# Patient Record
Sex: Female | Born: 1990 | Race: Black or African American | Hispanic: No | Marital: Single | Smoking: Never smoker
Health system: Southern US, Community
[De-identification: ages and names within clinical notes are randomized; demographics above are authoritative.]

---

## 2012-10-03 ENCOUNTER — Emergency Department (HOSPITAL_COMMUNITY)
Admission: EM | Admit: 2012-10-03 | Discharge: 2012-10-03 | Disposition: A | Discharge status: Home / Self Care | Payer: Self-pay | Attending: Emergency Medicine | Admitting: Emergency Medicine

## 2012-10-03 ENCOUNTER — Encounter (HOSPITAL_COMMUNITY): Payer: Self-pay | Admitting: Emergency Medicine

## 2012-10-03 DIAGNOSIS — H01009 Unspecified blepharitis unspecified eye, unspecified eyelid: Secondary | ICD-10-CM | POA: Insufficient documentation

## 2012-10-03 DIAGNOSIS — H5789 Other specified disorders of eye and adnexa: Secondary | ICD-10-CM | POA: Insufficient documentation

## 2012-10-03 DIAGNOSIS — H01006 Unspecified blepharitis left eye, unspecified eyelid: Secondary | ICD-10-CM

## 2012-10-03 MED ORDER — ERYTHROMYCIN 5 MG/GM OP OINT
TOPICAL_OINTMENT | Freq: Once | OPHTHALMIC | Status: AC
  Administered 2012-10-03: 20:00:00 via OPHTHALMIC
  Filled 2012-10-03: qty 3.5

## 2012-10-03 NOTE — ED Notes (Signed)
Pt c/o L eye swelling, drainage, burning, and itching x3 days.  Pt also reports a bump is present.  Stated that she wear contact lenses, but took them out Friday.  Denies injury.

## 2012-10-03 NOTE — ED Provider Notes (Signed)
History   This chart was scribed for non-physician practitioner working with Isabel Briggs. Isabel Payor, MD by Isabel Briggs, ED Scribe. This patient was seen in room WTR5/WTR5 and the patient's care was started at 1922.   CSN: [id number]  Arrival date & time 10/03/12  Isabel Briggs   First MD Initiated Contact with Patient 10/03/12 1925      Chief Complaint  Patient presents with  . Eye Pain     The history is provided by the patient. No language interpreter was used.   Isabel Briggs is a 22 y.o. female who presents to the Emergency Department complaining of new, constant, gradually worsening left eye pain with associated swelling, burning, and itching starting 3 days ago. Pt states that today she noticed discharge from the same eye. Pt states she removed her contact lenses 1 day ago. She has not tried any alleviating factors. Pt denies fever, chills, HA, visual disturbances.   No past medical history on file.  No past surgical history on file.  No family history on file.  History  Substance Use Topics  . Smoking status: Not on file  . Smokeless tobacco: Not on file  . Alcohol Use: Not on file    OB History    No data available      Review of Systems  A complete 10 system review of systems was obtained and all systems are negative except as noted in the HPI and PMH.   Allergies  Review of patient's allergies indicates not on file.  Home Medications  No current outpatient prescriptions on file.  BP 144/93  Pulse 114  Temp 98.8 F (37.1 C) (Oral)  Resp 18  SpO2 100%  Physical Exam  Nursing note and vitals reviewed. Constitutional: She is oriented to person, place, and time. She appears well-developed and well-nourished. No distress.  HENT:  Head: Normocephalic and atraumatic.  Eyes: Conjunctivae normal and EOM are normal. Pupils are equal, round, and reactive to light. Right eye exhibits no discharge. Left eye exhibits discharge. Left eye exhibits no hordeolum. No foreign  body present in the left eye. Left conjunctiva is not injected.       Green and white discharge from lower left eyelid. No erythema, edema, tenderness, visual disturbances.  Neck: Neck supple. No tracheal deviation present.  Cardiovascular: Normal rate, regular rhythm and normal heart sounds.   Pulmonary/Chest: Effort normal and breath sounds normal. No respiratory distress. She has no wheezes. She has no rales.  Musculoskeletal: Normal range of motion.  Lymphadenopathy:    She has no cervical adenopathy.  Neurological: She is alert and oriented to person, place, and time.  Skin: Skin is warm and dry.  Psychiatric: She has a normal mood and affect. Her behavior is normal.    ED Course  Procedures (including critical care time)  DIAGNOSTIC STUDIES: Oxygen Saturation is 100% on room air, normal by my interpretation.    COORDINATION OF CARE:  7:39 PM Discussed treatment plan which includes eye drops with pt at bedside and pt agreed to plan.      Labs Reviewed - No data to display No results found.   1. Blepharitis of left eye       MDM  22 y/o female with anterior blepharitis. Rx azithromycin ointment. Advised to discard her contact lenses and begin a new pair in a week. Advised warm compresses. Return precautions discussed. Patient states understanding of plan and is agreeable.    I personally performed the services described in this documentation,  which was scribed in my presence. The recorded information has been reviewed and is accurate.    Isabel Mace, PA-C 10/03/12 1952

## 2012-10-03 NOTE — ED Provider Notes (Signed)
Medical screening examination/treatment/procedure(s) were performed by non-physician practitioner and as supervising physician I was immediately available for consultation/collaboration.  Terril Chestnut R. Shemiah Rosch, MD 10/03/12 2347 

## 2012-12-28 ENCOUNTER — Encounter (HOSPITAL_COMMUNITY): Payer: Self-pay | Admitting: Emergency Medicine

## 2012-12-28 ENCOUNTER — Emergency Department (HOSPITAL_COMMUNITY)
Admission: EM | Admit: 2012-12-28 | Discharge: 2012-12-28 | Disposition: A | Discharge status: Home / Self Care | Payer: Self-pay | Attending: Emergency Medicine | Admitting: Emergency Medicine

## 2012-12-28 DIAGNOSIS — N9489 Other specified conditions associated with female genital organs and menstrual cycle: Secondary | ICD-10-CM | POA: Insufficient documentation

## 2012-12-28 DIAGNOSIS — N760 Acute vaginitis: Secondary | ICD-10-CM | POA: Insufficient documentation

## 2012-12-28 DIAGNOSIS — R300 Dysuria: Secondary | ICD-10-CM | POA: Insufficient documentation

## 2012-12-28 DIAGNOSIS — N898 Other specified noninflammatory disorders of vagina: Secondary | ICD-10-CM | POA: Insufficient documentation

## 2012-12-28 DIAGNOSIS — Z8619 Personal history of other infectious and parasitic diseases: Secondary | ICD-10-CM | POA: Insufficient documentation

## 2012-12-28 DIAGNOSIS — Z3202 Encounter for pregnancy test, result negative: Secondary | ICD-10-CM | POA: Insufficient documentation

## 2012-12-28 LAB — URINALYSIS, ROUTINE W REFLEX MICROSCOPIC
Ketones, ur: NEGATIVE mg/dL
Leukocytes, UA: NEGATIVE
Nitrite: NEGATIVE
Protein, ur: 30 mg/dL — AB
pH: 6 (ref 5.0–8.0)

## 2012-12-28 LAB — WET PREP, GENITAL: Yeast Wet Prep HPF POC: NONE SEEN

## 2012-12-28 LAB — URINE MICROSCOPIC-ADD ON

## 2012-12-28 LAB — POCT PREGNANCY, URINE: Preg Test, Ur: NEGATIVE

## 2012-12-28 MED ORDER — FLUCONAZOLE 150 MG PO TABS: 150.0000 mg | ORAL_TABLET | Freq: Once | ORAL | Status: DC

## 2012-12-28 MED ORDER — METRONIDAZOLE 500 MG PO TABS: 500.0000 mg | ORAL_TABLET | Freq: Two times a day (BID) | ORAL | Status: DC

## 2012-12-28 NOTE — ED Notes (Signed)
Pt states that she has had internal vaginal itching and some swelling to external labia area since Sunday. Denies any open lesions. Denies any frequency.

## 2012-12-28 NOTE — ED Provider Notes (Signed)
History    This chart was scribed for non-physician practitioner working with Isabel Briggs. [name], MD by Sofie Rower, ED Scribe. This patient was seen in room WTR9/WTR9 and the patient's care was started at 6:06PM.   CSN: [id number]  Arrival date & time 12/28/12  1713   First MD Initiated Contact with Patient 12/28/12 1806      Chief Complaint  Patient presents with  . Vaginal Itching    (Consider location/radiation/quality/duration/timing/severity/associated sxs/prior treatment) The history is provided by the patient. No language interpreter was used.    Isabel Briggs is a 22 y.o. female , with a hx of yeast infection (diagnosed 1 year ago), who presents to the Emergency Department complaining of gradual, progressively worsening, vaginal itching, onset thee days ago (12/25/12).  Associated symptoms include dysuria, white, thick, curd like, vaginal discharge and vaginal swelling. The pt reports she has been experiencing vaginal itching and swelling since Saturday afternoon (12/25/12), which has prompted her concern and desire to seek medical evaluation this evening (12/28/12). Additionally, the pt informs she has abstained from any sexual activity within the past year.  The pt denies abdominal pain, vomiting, fever, and chills.   The pt does not smoke or drink alcohol.      History reviewed. No pertinent past medical history.  History reviewed. No pertinent past surgical history.  No family history on file.  History  Substance Use Topics  . Smoking status: Never Smoker   . Smokeless tobacco: Not on file  . Alcohol Use: No    OB History   Grav Para Term Preterm Abortions TAB SAB Ect Mult Living                  Review of Systems  Constitutional: Negative for fever and chills.  Gastrointestinal: Negative for vomiting and abdominal pain.  Genitourinary: Positive for dysuria, vaginal discharge and vaginal pain.  All other systems reviewed and are  negative.    Allergies  Review of patient's allergies indicates no known allergies.  Home Medications  No current outpatient prescriptions on file.  BP 148/86  Pulse 82  Temp(Src) 98.3 F (36.8 C) (Oral)  Resp 20  SpO2 100%  Physical Exam  Nursing note and vitals reviewed. Constitutional: She is oriented to person, place, and time. She appears well-developed and well-nourished. No distress.  HENT:  Head: Normocephalic and atraumatic.  Eyes: EOM are normal.  Neck: Neck supple. No tracheal deviation present.  Cardiovascular: Normal rate.   Pulmonary/Chest: Effort normal. No respiratory distress.  Musculoskeletal: Normal range of motion.  Neurological: She is alert and oriented to person, place, and time.  Skin: Skin is warm and dry.  Psychiatric: She has a normal mood and affect. Her behavior is normal.    ED Course  Procedures (including critical care time)  DIAGNOSTIC STUDIES: Oxygen Saturation is 100% on room air, normal by my interpretation.    COORDINATION OF CARE:  6:22 PM- Treatment plan concerning pelvic exam discussed with patient. Pt agrees with treatment.  7:18 PM- Recheck. Pelvic exam performed. Chaperone present. Treatment plan discussed with patient. Pt agrees with treatment.       Results for orders placed during the hospital encounter of 12/28/12  GC/CHLAMYDIA PROBE AMP      Result Value Range   CT Probe RNA NEGATIVE  NEGATIVE   GC Probe RNA NEGATIVE  NEGATIVE  WET PREP, GENITAL      Result Value Range   Yeast Wet Prep HPF POC NONE SEEN  NONE  SEEN   Trich, Wet Prep NONE SEEN  NONE SEEN   Clue Cells Wet Prep HPF POC RARE (*) NONE SEEN   WBC, Wet Prep HPF POC RARE (*) NONE SEEN  URINALYSIS, ROUTINE W REFLEX MICROSCOPIC      Result Value Range   Color, Urine YELLOW  YELLOW   APPearance CLOUDY (*) CLEAR   Specific Gravity, Urine 1.028  1.005 - 1.030   pH 6.0  5.0 - 8.0   Glucose, UA NEGATIVE  NEGATIVE mg/dL   Hgb urine dipstick NEGATIVE   NEGATIVE   Bilirubin Urine SMALL (*) NEGATIVE   Ketones, ur NEGATIVE  NEGATIVE mg/dL   Protein, ur 30 (*) NEGATIVE mg/dL   Urobilinogen, UA 1.0  0.0 - 1.0 mg/dL   Nitrite NEGATIVE  NEGATIVE   Leukocytes, UA NEGATIVE  NEGATIVE  URINE MICROSCOPIC-ADD ON      Result Value Range   Squamous Epithelial / LPF RARE  RARE   Bacteria, UA RARE  RARE  POCT PREGNANCY, URINE      Result Value Range   Preg Test, Ur NEGATIVE  NEGATIVE       No results found.   1. Vaginitis and vulvovaginitis       MDM  Patient with vulvovaginitis. Wet prep shows clue cells, however presentation and appearance of vaginal discharge consistent  With yeast infection. I will d/c with flagyl and  Diflucan. F/.u WOC. No cervicitis, or concern for PID. The patient appears reasonably screened and/or stabilized for discharge and I doubt any other medical condition or other [facility] requiring further screening, evaluation, or treatment in the ED at this time prior to discharge.       I personally performed the services described in this documentation, which was scribed in my presence. The recorded information has been reviewed and is accurate.     Arthor Captain, PA-C 12/30/12 1935

## 2012-12-28 NOTE — Discharge Instructions (Signed)
Do not drink alcohol while taking these medications. Follow up with [facility] hospital outpatient clinic if your symptoms persisit.   Bacterial Vaginosis Bacterial vaginosis is an infection of the vagina. A healthy vagina has many kinds of good germs (bacteria). Sometimes the number of good germs can change. This allows bad germs to move in and cause an infection. You may be given medicine (antibiotics) to treat the infection. Or, you may not need treatment at all. HOME CARE  Take your medicine as told. Finish them even if you start to feel better.  Do not have sex until you finish your medicine.  Do not douche.  Practice safe sex.  Tell your sex partner that you have an infection. They should see their doctor for treatment if they have problems. GET HELP RIGHT AWAY IF:  You do not get better after 3 days of treatment.  You have grey fluid (discharge) coming from your vagina.  You have pain.  You have a temperature of 102 F (38.9 C) or higher. MAKE SURE YOU:   Understand these instructions.  Will watch your condition.  Will get help right away if you are not doing well or get worse. Document Released: 05/27/2008 Document Revised: 11/10/2011 Document Reviewed: 05/27/2008 [facility] Patient Information 2013 [place], [place]. Monilial Vaginitis    Vaginitis in a soreness, swelling and redness (inflammation) of the vagina and vulva. Monilial vaginitis is not a sexually transmitted infection.  CAUSES  Yeast vaginitis is caused by yeast (candida) that is normally found in your vagina. With a yeast infection, the candida has overgrown in number to a point that upsets the chemical balance.  SYMPTOMS  White, thick vaginal discharge.  Swelling, itching, redness and irritation of the vagina and possibly the lips of the vagina (vulva).  Burning or painful urination.  Painful intercourse. DIAGNOSIS  Things that may contribute to monilial vaginitis are:  Postmenopausal and virginal  states.  Pregnancy.  Infections.  Being tired, sick or stressed, especially if you had monilial vaginitis in the past.  Diabetes. Good control will help lower the chance.  Birth control pills.  Tight fitting garments.  Using bubble bath, feminine sprays, douches or deodorant tampons.  Taking certain medications that kill germs (antibiotics).  Sporadic recurrence can occur if you become ill. TREATMENT  Your caregiver will give you medication.  There are several kinds of anti monilial vaginal creams and suppositories specific for monilial vaginitis. For recurrent yeast infections, use a suppository or cream in the vagina 2 times a week, or as directed.  Anti-monilial or steroid cream for the itching or irritation of the vulva may also be used. Get your caregiver's permission.  Painting the vagina with methylene blue solution may help if the monilial cream does not work.  Eating yogurt may help prevent monilial vaginitis. HOME CARE INSTRUCTIONS  Finish all medication as prescribed.  Do not have sex until treatment is completed or after your caregiver tells you it is okay.  Take warm sitz baths.  Do not douche.  Do not use tampons, especially scented ones.  Wear cotton underwear.  Avoid tight pants and panty hose.  Tell your sexual partner that you have a yeast infection. They should go to their caregiver if they have symptoms such as mild rash or itching.  Your sexual partner should be treated as well if your infection is difficult to eliminate.  Practice safer sex. Use condoms.  Some vaginal medications cause latex condoms to fail. Vaginal medications that harm condoms are:  Cleocin cream.  Butoconazole (Femstat).  Terconazole (Terazol) vaginal suppository.  Miconazole (Monistat) (may be purchased over the counter). SEEK MEDICAL CARE IF:  You have a temperature by mouth above 102 F (38.9 C).  The infection is getting worse after 2 days of treatment.  The infection is not getting  better after 3 days of treatment.  You develop blisters in or around your vagina.  You develop vaginal bleeding, and it is not your menstrual period.  You have pain when you urinate.  You develop intestinal problems.  You have pain with sexual intercourse. Document Released: 05/28/2005 Document Revised: 11/10/2011 Document Reviewed: 02/09/2009  [facility] Patient Information 2013 [place], [place].

## 2012-12-31 NOTE — ED Provider Notes (Signed)
Medical screening examination/treatment/procedure(s) were performed by non-physician practitioner and as supervising physician I was immediately available for consultation/collaboration  Ariell Gunnels R. Manha Amato, MD 12/31/12 1632 

## 2015-03-14 ENCOUNTER — Emergency Department (HOSPITAL_COMMUNITY)
Admission: EM | Admit: 2015-03-14 | Discharge: 2015-03-14 | Disposition: A | Discharge status: Home / Self Care | Payer: BLUE CROSS/BLUE SHIELD | Attending: Emergency Medicine | Admitting: Emergency Medicine

## 2015-03-14 ENCOUNTER — Encounter (HOSPITAL_COMMUNITY): Payer: Self-pay

## 2015-03-14 DIAGNOSIS — Z3202 Encounter for pregnancy test, result negative: Secondary | ICD-10-CM | POA: Insufficient documentation

## 2015-03-14 DIAGNOSIS — R000 Tachycardia, unspecified: Secondary | ICD-10-CM | POA: Insufficient documentation

## 2015-03-14 DIAGNOSIS — N390 Urinary tract infection, site not specified: Secondary | ICD-10-CM | POA: Insufficient documentation

## 2015-03-14 DIAGNOSIS — R6883 Chills (without fever): Secondary | ICD-10-CM | POA: Insufficient documentation

## 2015-03-14 DIAGNOSIS — N898 Other specified noninflammatory disorders of vagina: Secondary | ICD-10-CM | POA: Insufficient documentation

## 2015-03-14 DIAGNOSIS — R110 Nausea: Secondary | ICD-10-CM | POA: Diagnosis not present

## 2015-03-14 DIAGNOSIS — M549 Dorsalgia, unspecified: Secondary | ICD-10-CM | POA: Diagnosis not present

## 2015-03-14 DIAGNOSIS — R300 Dysuria: Secondary | ICD-10-CM | POA: Diagnosis present

## 2015-03-14 LAB — CBC
HEMATOCRIT: 41.7 % (ref 36.0–46.0)
Hemoglobin: 13.9 g/dL (ref 12.0–15.0)
MCH: 30.8 pg (ref 26.0–34.0)
MCHC: 33.3 g/dL (ref 30.0–36.0)
MCV: 92.3 fL (ref 78.0–100.0)
Platelets: 253 10*3/uL (ref 150–400)
RBC: 4.52 MIL/uL (ref 3.87–5.11)
RDW: 13 % (ref 11.5–15.5)
WBC: 8.1 10*3/uL (ref 4.0–10.5)

## 2015-03-14 LAB — COMPREHENSIVE METABOLIC PANEL
ALT: 16 U/L (ref 14–54)
AST: 20 U/L (ref 15–41)
Albumin: 4.6 g/dL (ref 3.5–5.0)
Alkaline Phosphatase: 52 U/L (ref 38–126)
Anion gap: 9 (ref 5–15)
BUN: 20 mg/dL (ref 6–20)
CHLORIDE: 106 mmol/L (ref 101–111)
CO2: 23 mmol/L (ref 22–32)
CREATININE: 0.76 mg/dL (ref 0.44–1.00)
Calcium: 9.3 mg/dL (ref 8.9–10.3)
GFR calc Af Amer: >60 mL/min (ref >60)
GFR calc non Af Amer: >60 mL/min (ref >60)
Glucose, Bld: 103 mg/dL — ABNORMAL HIGH (ref 65–99)
Potassium: 3.3 mmol/L — ABNORMAL LOW (ref 3.5–5.1)
SODIUM: 138 mmol/L (ref 135–145)
TOTAL PROTEIN: 7.6 g/dL (ref 6.5–8.1)
Total Bilirubin: 1 mg/dL (ref 0.3–1.2)

## 2015-03-14 LAB — WET PREP, GENITAL
CLUE CELLS WET PREP: NONE SEEN
TRICH WET PREP: NONE SEEN
Yeast Wet Prep HPF POC: NONE SEEN

## 2015-03-14 LAB — URINE MICROSCOPIC-ADD ON

## 2015-03-14 LAB — LIPASE, BLOOD: Lipase: 29 U/L (ref 22–51)

## 2015-03-14 LAB — URINALYSIS, ROUTINE W REFLEX MICROSCOPIC
Bilirubin Urine: NEGATIVE
Glucose, UA: NEGATIVE mg/dL
Hgb urine dipstick: NEGATIVE
KETONES UR: NEGATIVE mg/dL
LEUKOCYTES UA: NEGATIVE
NITRITE: POSITIVE — AB
Protein, ur: NEGATIVE mg/dL
SPECIFIC GRAVITY, URINE: 1.014 (ref 1.005–1.030)
Urobilinogen, UA: 1 mg/dL (ref 0.0–1.0)
pH: 6.5 (ref 5.0–8.0)

## 2015-03-14 LAB — POC URINE PREG, ED: PREG TEST UR: NEGATIVE

## 2015-03-14 MED ORDER — PHENAZOPYRIDINE HCL 200 MG PO TABS: 200.0000 mg | ORAL_TABLET | Freq: Three times a day (TID) | ORAL | Status: DC | PRN

## 2015-03-14 MED ORDER — POTASSIUM CHLORIDE CRYS ER 20 MEQ PO TBCR
40.0000 meq | EXTENDED_RELEASE_TABLET | Freq: Once | ORAL | Status: AC
  Administered 2015-03-14: 40 meq via ORAL
  Filled 2015-03-14: qty 2

## 2015-03-14 MED ORDER — NITROFURANTOIN MONOHYD MACRO 100 MG PO CAPS: 100.0000 mg | ORAL_CAPSULE | Freq: Two times a day (BID) | ORAL | Status: DC

## 2015-03-14 MED ORDER — SODIUM CHLORIDE 0.9 % IV BOLUS (SEPSIS)
1000.0000 mL | Freq: Once | INTRAVENOUS | Status: AC
  Administered 2015-03-14: 1000 mL via INTRAVENOUS

## 2015-03-14 NOTE — ED Notes (Signed)
MD at bedside. 

## 2015-03-14 NOTE — ED Provider Notes (Signed)
CSN: [id number]     Arrival date & time 03/14/15  0702 History   First MD Initiated Contact with Patient 03/14/15 [phone]     Chief Complaint  Patient presents with  . Abdominal Pain  . Dysuria     (Consider location/radiation/quality/duration/timing/severity/associated sxs/prior Treatment) HPI  24 year old female presents with lower abdominal pain 1 week. She's been having urinary frequency with small urine output over the same time. Some dysuria. Over the last day or so she has noticed some blood in her urine. Patient has had some nausea over the last 2 days but no vomiting. Intermittent low back pain as well. No fevers but has had some hot and cold chills. The lower abdominal pain is midline and constant. Denies vaginal bleeding but has had new vaginal discharge over the last 3 days. No prior STDs.  History reviewed. No pertinent past medical history. History reviewed. No pertinent past surgical history. History reviewed. No pertinent family history. History  Substance Use Topics  . Smoking status: Never Smoker   . Smokeless tobacco: Not on file  . Alcohol Use: No   OB History    No data available     Review of Systems  Constitutional: Positive for chills. Negative for fever.  Gastrointestinal: Positive for nausea and abdominal pain. Negative for vomiting.  Genitourinary: Positive for dysuria, frequency and vaginal discharge. Negative for vaginal bleeding.  Musculoskeletal: Positive for back pain.  All other systems reviewed and are negative.     Allergies  Review of patient's allergies indicates no known allergies.  Home Medications   Prior to Admission medications   Medication Sig Start Date End Date Taking? Authorizing Provider  fluconazole (DIFLUCAN) 150 MG tablet Take 1 tablet (150 mg total) by mouth once. 12/28/12   Arthor CaptainAbigail Harris, PA-C  metroNIDAZOLE (FLAGYL) 500 MG tablet Take 1 tablet (500 mg total) by mouth 2 (two) times daily. One po bid x 7 days 12/28/12    Arthor CaptainAbigail Harris, PA-C   BP 148/98 mmHg  Pulse 116  Temp(Src) 98.9 F (37.2 C) (Oral)  Resp 16  Ht 5\' 7"  (1.702 m)  Wt 126 lb (57.153 kg)  BMI 19.73 kg/m2  SpO2 100% Physical Exam  Constitutional: She is oriented to person, place, and time. She appears well-developed and well-nourished.  HENT:  Head: Normocephalic and atraumatic.  Right Ear: External ear normal.  Left Ear: External ear normal.  Nose: Nose normal.  Eyes: Right eye exhibits no discharge. Left eye exhibits no discharge.  Cardiovascular: Regular rhythm and normal heart sounds.  Tachycardia present.   Pulmonary/Chest: Effort normal and breath sounds normal.  Abdominal: Soft. She exhibits no distension. There is no tenderness. There is no CVA tenderness.  Genitourinary: Uterus is not enlarged and not tender. Cervix exhibits no motion tenderness and no friability. Right adnexum displays no mass and no tenderness. Left adnexum displays no mass and no tenderness. Vaginal discharge (mild, dark) found.  Neurological: She is alert and oriented to person, place, and time.  Skin: Skin is warm and dry.  Nursing note and vitals reviewed.   ED Course  Procedures (including critical care time) Labs Review Labs Reviewed  WET PREP, GENITAL - Abnormal; Notable for the following:    WBC, Wet Prep HPF POC FEW (*)    All other components within normal limits  COMPREHENSIVE METABOLIC PANEL - Abnormal; Notable for the following:    Potassium 3.3 (*)    Glucose, Bld 103 (*)    All other components within normal limits  URINALYSIS, ROUTINE W REFLEX MICROSCOPIC (NOT AT [facility]) - Abnormal; Notable for the following:    Color, Urine ORANGE (*)    Nitrite POSITIVE (*)    All other components within normal limits  URINE MICROSCOPIC-ADD ON - Abnormal; Notable for the following:    Bacteria, UA MANY (*)    All other components within normal limits  URINE CULTURE  CBC  LIPASE, BLOOD  POC URINE PREG, ED  GC/CHLAMYDIA PROBE AMP ()  NOT AT [facility]    Imaging Review No results found.   EKG Interpretation None      MDM   Final diagnoses:  UTI (lower urinary tract infection)    Patient appears well but was mildly tachycardic. Given this labs were evaluated and she was given IV fluids. She does have mild vaginal discharge but no other signs of cervicitis or PID. At this point after discussion we'll wait for GC/chlamydia to result prior to treatment. Her urine has positive nitrites though without leukocytes. Given her symptoms will treat as UTI. Has nausea but no vomiting, no CVA tenderness and no fever. Doubt pyelonephritis. Will treat with antibiotics and discuss strict return precautions. Will replete mild hypokalemia with one oral dose here.    Pricilla Loveless, MD 03/14/15 [id number]

## 2015-03-14 NOTE — ED Notes (Signed)
Per pt, lower abdominal pain x 2 weeks.  Pt states pain with urination and increase in frequency.  Pt also states recent blood in urine.  Pt nauseated with no vomiting or diarrhea.  Last bm - last pm

## 2015-03-15 LAB — URINE CULTURE

## 2015-03-15 LAB — GC/CHLAMYDIA PROBE AMP (~~LOC~~) NOT AT ARMC
CHLAMYDIA, DNA PROBE: NEGATIVE
NEISSERIA GONORRHEA: NEGATIVE

## 2015-07-20 ENCOUNTER — Emergency Department (HOSPITAL_COMMUNITY)
Admission: EM | Admit: 2015-07-20 | Discharge: 2015-07-20 | Disposition: A | Discharge status: Home / Self Care | Payer: BLUE CROSS/BLUE SHIELD | Attending: Emergency Medicine | Admitting: Emergency Medicine

## 2015-07-20 ENCOUNTER — Emergency Department (HOSPITAL_COMMUNITY): Payer: BLUE CROSS/BLUE SHIELD

## 2015-07-20 ENCOUNTER — Encounter (HOSPITAL_COMMUNITY): Payer: Self-pay

## 2015-07-20 DIAGNOSIS — R350 Frequency of micturition: Secondary | ICD-10-CM | POA: Diagnosis not present

## 2015-07-20 DIAGNOSIS — Z79818 Long term (current) use of other agents affecting estrogen receptors and estrogen levels: Secondary | ICD-10-CM | POA: Diagnosis not present

## 2015-07-20 DIAGNOSIS — R300 Dysuria: Secondary | ICD-10-CM | POA: Diagnosis not present

## 2015-07-20 DIAGNOSIS — R109 Unspecified abdominal pain: Secondary | ICD-10-CM

## 2015-07-20 DIAGNOSIS — R110 Nausea: Secondary | ICD-10-CM | POA: Diagnosis not present

## 2015-07-20 DIAGNOSIS — Z79899 Other long term (current) drug therapy: Secondary | ICD-10-CM | POA: Insufficient documentation

## 2015-07-20 DIAGNOSIS — R1033 Periumbilical pain: Secondary | ICD-10-CM | POA: Insufficient documentation

## 2015-07-20 LAB — COMPREHENSIVE METABOLIC PANEL
ALBUMIN: 4 g/dL (ref 3.5–5.0)
ALT: 14 U/L (ref 14–54)
AST: 15 U/L (ref 15–41)
Alkaline Phosphatase: 53 U/L (ref 38–126)
Anion gap: 8 (ref 5–15)
BUN: 20 mg/dL (ref 6–20)
CHLORIDE: 107 mmol/L (ref 101–111)
CO2: 24 mmol/L (ref 22–32)
CREATININE: 0.72 mg/dL (ref 0.44–1.00)
Calcium: 9.2 mg/dL (ref 8.9–10.3)
GFR calc non Af Amer: >60 mL/min (ref >60)
GLUCOSE: 92 mg/dL (ref 65–99)
Potassium: 3.8 mmol/L (ref 3.5–5.1)
SODIUM: 139 mmol/L (ref 135–145)
Total Bilirubin: 1 mg/dL (ref 0.3–1.2)
Total Protein: 6.9 g/dL (ref 6.5–8.1)

## 2015-07-20 LAB — CBC WITH DIFFERENTIAL/PLATELET
BASOS ABS: 0 10*3/uL (ref 0.0–0.1)
BASOS PCT: 0 %
EOS ABS: 0.1 10*3/uL (ref 0.0–0.7)
EOS PCT: 1 %
HCT: 41.3 % (ref 36.0–46.0)
HEMOGLOBIN: 13.6 g/dL (ref 12.0–15.0)
LYMPHS ABS: 2.1 10*3/uL (ref 0.7–4.0)
Lymphocytes Relative: 30 %
MCH: 30.6 pg (ref 26.0–34.0)
MCHC: 32.9 g/dL (ref 30.0–36.0)
MCV: 92.8 fL (ref 78.0–100.0)
Monocytes Absolute: 0.6 10*3/uL (ref 0.1–1.0)
Monocytes Relative: 8 %
NEUTROS PCT: 61 %
Neutro Abs: 4.3 10*3/uL (ref 1.7–7.7)
PLATELETS: 252 10*3/uL (ref 150–400)
RBC: 4.45 MIL/uL (ref 3.87–5.11)
RDW: 12.5 % (ref 11.5–15.5)
WBC: 7.1 10*3/uL (ref 4.0–10.5)

## 2015-07-20 LAB — URINALYSIS, ROUTINE W REFLEX MICROSCOPIC
Bilirubin Urine: NEGATIVE
Glucose, UA: NEGATIVE mg/dL
Hgb urine dipstick: NEGATIVE
Ketones, ur: NEGATIVE mg/dL
LEUKOCYTES UA: NEGATIVE
NITRITE: NEGATIVE
PROTEIN: NEGATIVE mg/dL
SPECIFIC GRAVITY, URINE: 1.028 (ref 1.005–1.030)
pH: 5.5 (ref 5.0–8.0)

## 2015-07-20 LAB — I-STAT BETA HCG BLOOD, ED (MC, WL, AP ONLY)

## 2015-07-20 LAB — LIPASE, BLOOD: LIPASE: 37 U/L (ref 11–51)

## 2015-07-20 MED ORDER — ONDANSETRON HCL 4 MG PO TABS: 4.0000 mg | ORAL_TABLET | Freq: Four times a day (QID) | ORAL | Status: DC

## 2015-07-20 MED ORDER — ONDANSETRON 8 MG PO TBDP
8.0000 mg | ORAL_TABLET | Freq: Once | ORAL | Status: AC
  Administered 2015-07-20: 8 mg via ORAL
  Filled 2015-07-20: qty 1

## 2015-07-20 NOTE — ED Provider Notes (Signed)
  Physical Exam  BP 131/93 mmHg  Pulse 96  Temp(Src) 98.1 F (36.7 C) (Oral)  Resp 12  Ht 5\' 6"  (1.676 m)  Wt 137 lb (62.143 kg)  BMI 22.12 kg/m2  SpO2 100%  Physical Exam Abdomen: No focal abdominal tenderness to palpation. ED Course  Procedures Patient presented with nausea and diffuse abdominal pain. She denied any fever, vomiting, diarrhea. She is on Depo for bc.   Patient was signed out to me by Jaynie Crumbleatyana Kirichenko, PA-C. The plan was to DC the patient if labs were normal with Zofran and PCP follow-up. She is not pregnant. Her labs are unremarkable. She does not have a UTI. Her abdominal x-ray is negative for bowel obstruction or constipation. I do not suspect ovarian torsion, colitis, appendicitis, ruptured ovarian cyst. I discussed with the patient that she should follow-up with her PCP or GYN on Monday. I gave the patient return precautions. She was also given a prescription for Zofran. She agrees with the plan. Catha Gosselin.    Yi Falletta Patel-Mills, PA-C 07/20/15 1515  Tomasita CrumbleAdeleke Oni, MD 07/20/15 1539

## 2015-07-20 NOTE — ED Notes (Signed)
Pt complains of abd pain and being nauseated for three weeks

## 2015-07-20 NOTE — Discharge Instructions (Signed)
Abdominal Pain, Adult Follow-up with your OB/GYN. Many things can cause abdominal pain. Usually, abdominal pain is not caused by a disease and will improve without treatment. It can often be observed and treated at home. Your health care provider will do a physical exam and possibly order blood tests and X-rays to help determine the seriousness of your pain. However, in many cases, more time must pass before a clear cause of the pain can be found. Before that point, your health care provider may not know if you need more testing or further treatment. HOME CARE INSTRUCTIONS Monitor your abdominal pain for any changes. The following actions may help to alleviate any discomfort you are experiencing:  Only take over-the-counter or prescription medicines as directed by your health care provider.  Do not take laxatives unless directed to do so by your health care provider.  Try a clear liquid diet (broth, tea, or water) as directed by your health care provider. Slowly move to a bland diet as tolerated. SEEK MEDICAL CARE IF:  You have unexplained abdominal pain.  You have abdominal pain associated with nausea or diarrhea.  You have pain when you urinate or have a bowel movement.  You experience abdominal pain that wakes you in the night.  You have abdominal pain that is worsened or improved by eating food.  You have abdominal pain that is worsened with eating fatty foods.  You have a fever. SEEK IMMEDIATE MEDICAL CARE IF:  Your pain does not go away within 2 hours.  You keep throwing up (vomiting).  Your pain is felt only in portions of the abdomen, such as the right side or the left lower portion of the abdomen.  You pass bloody or black tarry stools. MAKE SURE YOU:  Understand these instructions.  Will watch your condition.  Will get help right away if you are not doing well or get worse.   This information is not intended to replace advice given to you by your health care  provider. Make sure you discuss any questions you have with your health care provider.   Document Released: 05/28/2005 Document Revised: 05/09/2015 Document Reviewed: 04/27/2013 [employer] Interactive Patient Education [employer]2016 [employer]

## 2015-07-20 NOTE — ED Provider Notes (Signed)
CSN: [id number]     Arrival date & time 07/20/15  0401 History   First MD Initiated Contact with Patient 07/20/15 0424     Chief Complaint  Patient presents with  . Abdominal Pain     (Consider location/radiation/quality/duration/timing/severity/associated sxs/prior Treatment) HPI Isabel Briggs is a 24 y.o. female who presents to ED with complaint of nausea and abdominal pain for 3 weeks. Patient states symptoms worsened last night so she came to emergency department. Patient denies any vomiting. She states pain is mainly to the lower abdomen. Denies any vaginal discharge or bleeding. Denies possibility of being pregnant. She does report dysuria and urinary frequency. Denies any fever or chills. No flank pain. She has been eating and drinking normally. Denies trying any medication to help with her symptoms. Patient states she normally does not have periods Since she is on Depo. States last bowel movement was "sometimes in the last week." states she does not have daily BMs. No prior abdominal surgeries. No new medications. No other complaints.   History reviewed. No pertinent past medical history. History reviewed. No pertinent past surgical history. History reviewed. No pertinent family history. Social History  Substance Use Topics  . Smoking status: Never Smoker   . Smokeless tobacco: None  . Alcohol Use: No   OB History    No data available     Review of Systems  Constitutional: Negative for fever and chills.  Respiratory: Negative for cough, chest tightness and shortness of breath.   Cardiovascular: Negative for chest pain, palpitations and leg swelling.  Gastrointestinal: Positive for abdominal pain. Negative for nausea, vomiting and diarrhea.  Genitourinary: Positive for dysuria and frequency. Negative for flank pain, vaginal bleeding, vaginal discharge, vaginal pain and pelvic pain.  Musculoskeletal: Negative for myalgias, arthralgias, neck pain and neck stiffness.  Skin:  Negative for rash.  Neurological: Negative for dizziness, weakness and headaches.  All other systems reviewed and are negative.     Allergies  Review of patient's allergies indicates no known allergies.  Home Medications   Prior to Admission medications   Medication Sig Start Date End Date Taking? Authorizing Provider  hydrOXYzine (ATARAX/VISTARIL) 25 MG tablet Take 1 tablet by mouth at bedtime as needed. anxiety 05/25/15  Yes Historical Provider, MD  butalbital-acetaminophen-caffeine (FIORICET, ESGIC) 50-325-40 MG tablet Take 1 tablet by mouth See admin instructions. At onset of migraine. 05/25/15   Historical Provider, MD  fluconazole (DIFLUCAN) 150 MG tablet Take 1 tablet (150 mg total) by mouth once. Patient not taking: Reported on 03/14/2015 12/28/12   Arthor Captain, PA-C  medroxyPROGESTERone (DEPO-PROVERA) 150 MG/ML injection Inject 150 mg into the muscle every 3 (three) months.     Historical Provider, MD  metroNIDAZOLE (FLAGYL) 500 MG tablet Take 1 tablet (500 mg total) by mouth 2 (two) times daily. One po bid x 7 days Patient not taking: Reported on 03/14/2015 12/28/12   Arthor Captain, PA-C  nitrofurantoin, macrocrystal-monohydrate, (MACROBID) 100 MG capsule Take 1 capsule (100 mg total) by mouth 2 (two) times daily. X 7 days Patient not taking: Reported on 07/20/2015 03/14/15   Pricilla Loveless, MD  phenazopyridine (PYRIDIUM) 200 MG tablet Take 1 tablet (200 mg total) by mouth 3 (three) times daily as needed for pain. Patient not taking: Reported on 07/20/2015 03/14/15   Pricilla Loveless, MD   BP 151/91 mmHg  Pulse 109  Temp(Src) 98.1 F (36.7 C) (Oral)  Resp 18  Ht  (1.676 m)  Wt 137 lb (62.143 kg)  BMI 22.12 kg/m2  SpO2 100% Physical Exam  Constitutional: She is oriented to person, place, and time. She appears well-developed and well-nourished. No distress.  HENT:  Head: Normocephalic.  Eyes: Conjunctivae are normal.  Neck: Neck supple.  Cardiovascular: Normal rate,  regular rhythm and normal heart sounds.   Pulmonary/Chest: Effort normal and breath sounds normal. No respiratory distress. She has no wheezes. She has no rales.  Abdominal: Soft. Bowel sounds are normal. She exhibits no distension. There is no tenderness. There is no rebound and no guarding.  No CVA tenderness  Musculoskeletal: She exhibits no edema.  Neurological: She is alert and oriented to person, place, and time.  Skin: Skin is warm and dry.  Psychiatric: She has a normal mood and affect. Her behavior is normal.  Nursing note and vitals reviewed.   ED Course  Procedures (including critical care time) Labs Review Labs Reviewed  URINE CULTURE  URINALYSIS, ROUTINE W REFLEX MICROSCOPIC (NOT [facility])  LIPASE, BLOOD  CBC WITH DIFFERENTIAL/PLATELET  COMPREHENSIVE METABOLIC PANEL  I-STAT BETA HCG BLOOD, ED ([facility], [facility], [facility] ONLY)    Imaging Review Dg Abd 2 Views  07/20/2015  CLINICAL DATA:  Mid to lower abdominal pain and nausea. Symptoms for 3 weeks. EXAM: ABDOMEN - 2 VIEW COMPARISON:  None. FINDINGS: Normal bowel gas pattern without dilated bowel loops. Small to moderate volume of colonic stool. No free intra-abdominal air. No radiopaque calculi. The lung bases are clear. No acute osseous abnormalities. IMPRESSION: Unremarkable radiographs of the abdomen.  Normal bowel gas pattern. Electronically Signed   By: Rubye OaksMelanie  Ehinger M.D.   On: 07/20/2015 06:12   I have personally reviewed and evaluated these images and lab results as part of my medical decision-making.   EKG Interpretation None      MDM   Final diagnoses:  Abdominal pain    Pt with nausea and peri umbilical pain for 3 weeks. No tenderness on exam. Will get labs, UA. Pt denies vaginal complaints. Get 2 view abdomen to evaluate for constipation  Pt Signed out to PA Patel-Mills pending lab work. If negative, do not think she needs any further imaging on emergent basis. Will need to follow up with pcp. Pt is non toxic  appearing with NO abdominal tenderness on exam. Normal VS. UA negative. Not pregnant.   Filed Vitals:   07/20/15 0408 07/20/15 0718 07/20/15 0952  BP: 151/91 130/94 131/93  Pulse: 109 99 96  Temp: 98.1 F (36.7 C)    TempSrc: Oral    Resp: 18 16 12   Height: 5\' 6"  (1.676 m)    Weight: 137 lb (62.143 kg)    SpO2: 100% 99% 100%       Jaynie Crumbleatyana Carlus Stay, PA-C 07/21/15 0557  Tomasita CrumbleAdeleke Oni, MD 07/21/15 [phone]

## 2015-07-20 NOTE — ED Notes (Signed)
PA addressed lab results.  Blood not found in lab.  Pt notified of delay.  Pt understanding.  PA reordered labs.

## 2015-07-21 LAB — URINE CULTURE
Culture: NO GROWTH
Special Requests: NORMAL

## 2015-08-14 ENCOUNTER — Encounter (HOSPITAL_COMMUNITY): Payer: Self-pay | Admitting: Emergency Medicine

## 2015-08-14 ENCOUNTER — Emergency Department (HOSPITAL_COMMUNITY)
Admission: EM | Admit: 2015-08-14 | Discharge: 2015-08-14 | Disposition: A | Discharge status: Home / Self Care | Payer: BLUE CROSS/BLUE SHIELD | Attending: Emergency Medicine | Admitting: Emergency Medicine

## 2015-08-14 DIAGNOSIS — K5900 Constipation, unspecified: Secondary | ICD-10-CM | POA: Diagnosis present

## 2015-08-14 DIAGNOSIS — Z793 Long term (current) use of hormonal contraceptives: Secondary | ICD-10-CM | POA: Insufficient documentation

## 2015-08-14 DIAGNOSIS — Z79899 Other long term (current) drug therapy: Secondary | ICD-10-CM | POA: Insufficient documentation

## 2015-08-14 DIAGNOSIS — K5909 Other constipation: Secondary | ICD-10-CM

## 2015-08-14 MED ORDER — SENNOSIDES-DOCUSATE SODIUM 8.6-50 MG PO TABS: ORAL_TABLET | ORAL | Status: AC

## 2015-08-14 MED ORDER — POLYETHYLENE GLYCOL 3350 17 GM/SCOOP PO POWD: 17.0000 g | Freq: Two times a day (BID) | ORAL | Status: AC | PRN

## 2015-08-14 NOTE — Discharge Instructions (Signed)
Take magnesium citrate 150mg  every 12 hours. You should continue to take Senakot. If this you have no relief in the the next 24 hours you should start taking miralax until you have a bowel movement. You may also try a fleet enema which are available over -the counter  Please follow with your primary care doctor in the next 2 days for a check-up. They must obtain records for further management.   Do not hesitate to return to the Emergency Department for any new, worsening or concerning symptoms.    Constipation, Adult Constipation is when a person has fewer than three bowel movements a week, has difficulty having a bowel movement, or has stools that are dry, hard, or larger than normal. As people grow older, constipation is more common. A low-fiber diet, not taking in enough fluids, and taking certain medicines may make constipation worse.  CAUSES   Certain medicines, such as antidepressants, pain medicine, iron supplements, antacids, and water pills.   Certain diseases, such as diabetes, irritable bowel syndrome (IBS), thyroid disease, or depression.   Not drinking enough water.   Not eating enough fiber-rich foods.   Stress or travel.   Lack of physical activity or exercise.   Ignoring the urge to have a bowel movement.   Using laxatives too much.  SIGNS AND SYMPTOMS   Having fewer than three bowel movements a week.   Straining to have a bowel movement.   Having stools that are hard, dry, or larger than normal.   Feeling full or bloated.   Pain in the lower abdomen.   Not feeling relief after having a bowel movement.  DIAGNOSIS  Your health care provider will take a medical history and perform a physical exam. Further testing may be done for severe constipation. Some tests may include:  A barium enema X-ray to examine your rectum, colon, and, sometimes, your small intestine.   A sigmoidoscopy to examine your lower colon.   A colonoscopy to examine your  entire colon. TREATMENT  Treatment will depend on the severity of your constipation and what is causing it. Some dietary treatments include drinking more fluids and eating more fiber-rich foods. Lifestyle treatments may include regular exercise. If these diet and lifestyle recommendations do not help, your health care provider may recommend taking over-the-counter laxative medicines to help you have bowel movements. Prescription medicines may be prescribed if over-the-counter medicines do not work.  HOME CARE INSTRUCTIONS   Eat foods that have a lot of fiber, such as fruits, vegetables, whole grains, and beans.  Limit foods high in fat and processed sugars, such as french fries, hamburgers, cookies, candies, and soda.   A fiber supplement may be added to your diet if you cannot get enough fiber from foods.   Drink enough fluids to keep your urine clear or pale yellow.   Exercise regularly or as directed by your health care provider.   Go to the restroom when you have the urge to go. Do not hold it.   Only take over-the-counter or prescription medicines as directed by your health care provider. Do not take other medicines for constipation without talking to your health care provider first.  SEEK IMMEDIATE MEDICAL CARE IF:   You have bright red blood in your stool.   Your constipation lasts for more than 4 days or gets worse.   You have abdominal or rectal pain.   You have thin, pencil-like stools.   You have unexplained weight loss. MAKE SURE YOU:  Understand these instructions.  Will watch your condition.  Will get help right away if you are not doing well or get worse.   This information is not intended to replace advice given to you by your health care provider. Make sure you discuss any questions you have with your health care provider.   Document Released: 05/16/2004 Document Revised: 09/08/2014 Document Reviewed: 05/30/2013 Elsevier Interactive Patient  Education [employer]

## 2015-08-14 NOTE — ED Provider Notes (Signed)
CSN: [id number]     Arrival date & time 08/14/15  1337 History   First MD Initiated Contact with Patient 08/14/15 1535     Chief Complaint  Patient presents with  . Abdominal Pain  . Constipation     (Consider location/radiation/quality/duration/timing/severity/associated sxs/prior Treatment) HPI   Blood pressure 151/90, pulse 92, temperature 98.1 F (36.7 C), temperature source Oral, resp. rate 16, SpO2 100 %.  Burlene ArntKinrecka Current is a 24 y.o. female complaining of intermittent colicky generalized abdominal pain worsening left lower quadrant which she's had chronically, worse over the last month. States that she hasn't had a full bowel movement in 4 weeks. Patient's been taking Vicodin for pain, she's been taking Dulcolax and get herself home enemas with little relief. States that she doesn't drink water because she doesn't like the taste but drinks lots of juice. Patient is in town for school, saw her primary care here. She was seen for similar about a month ago, she was advised to go to OB/GYN, she was seen in OB/GYN and cleared from that perspective. She is in minimal pain right now but states it comes and goes. States that she has to eat small volume meals and she has intermittent emesis but none recently. She denies fever, chills, focal abdominal pain, dysuria, hematuria, urinary frequency, abnormal vaginal discharge, she's not concerned about STDs.  History reviewed. No pertinent past medical history. History reviewed. No pertinent past surgical history. History reviewed. No pertinent family history. Social History  Substance Use Topics  . Smoking status: Never Smoker   . Smokeless tobacco: None  . Alcohol Use: No   OB History    No data available     Review of Systems  10 systems reviewed and found to be negative, except as noted in the HPI.   Allergies  Review of patient's allergies indicates no known allergies.  Home Medications   Prior to Admission medications    Medication Sig Start Date End Date Taking? Authorizing Provider  acetaminophen (TYLENOL) 500 MG tablet Take 1,000 mg by mouth daily as needed for mild pain, moderate pain, fever or headache.   Yes Historical Provider, MD  butalbital-acetaminophen-caffeine (FIORICET, ESGIC) 50-325-40 MG tablet Take 1 tablet by mouth See admin instructions. At onset of migraine. 05/25/15  Yes Historical Provider, MD  HYDROcodone-acetaminophen (NORCO/VICODIN) 5-325 MG tablet Take 0.5-1 tablets by mouth every 6 (six) hours as needed. For pain 07/25/15  Yes Historical Provider, MD  hydrOXYzine (ATARAX/VISTARIL) 25 MG tablet Take 1 tablet by mouth at bedtime as needed. anxiety 05/25/15  Yes Historical Provider, MD  medroxyPROGESTERone (DEPO-PROVERA) 150 MG/ML injection Inject 150 mg into the muscle every 3 (three) months.    Yes Historical Provider, MD  nitrofurantoin, macrocrystal-monohydrate, (MACROBID) 100 MG capsule Take 1 capsule (100 mg total) by mouth 2 (two) times daily. X 7 days Patient not taking: Reported on 07/20/2015 03/14/15   Pricilla LovelessScott Goldston, MD  ondansetron (ZOFRAN) 4 MG tablet Take 1 tablet (4 mg total) by mouth every 6 (six) hours. Patient not taking: Reported on 08/14/2015 07/20/15   Catha GosselinHanna Patel-Mills, PA-C  phenazopyridine (PYRIDIUM) 200 MG tablet Take 1 tablet (200 mg total) by mouth 3 (three) times daily as needed for pain. Patient not taking: Reported on 07/20/2015 03/14/15   Pricilla LovelessScott Goldston, MD  polyethylene glycol powder (GLYCOLAX/MIRALAX) powder Take 17 g by mouth 2 (two) times daily as needed. Until daily soft stools  OTC 08/14/15   Iman Orourke, PA-C  senna-docusate (SENOKOT-S) 8.6-50 MG tablet Start 1 tab  QHS, up to 2tabs BID. Max 4 tabs daily for constipation 08/14/15   Anthem Frazer, PA-C   BP 151/90 mmHg  Pulse 92  Temp(Src) 98.1 F (36.7 C) (Oral)  Resp 16  SpO2 100% Physical Exam  Constitutional: She is oriented to person, place, and time. She appears well-developed and  well-nourished. No distress.  HENT:  Head: Normocephalic and atraumatic.  Mouth/Throat: Oropharynx is clear and moist.  Eyes: Conjunctivae and EOM are normal. Pupils are equal, round, and reactive to light.  Neck: Normal range of motion.  Cardiovascular: Normal rate, regular rhythm and intact distal pulses.   Pulmonary/Chest: Effort normal and breath sounds normal.  Abdominal: Soft. There is tenderness.  Mild, diffuse tenderness to palpation with no guarding or rebound.  Murphy sign negative, no tenderness to palpation over McBurney's point, Rovsings, Psoas and obturator all negative.   Musculoskeletal: Normal range of motion.  Neurological: She is alert and oriented to person, place, and time.  Skin: She is not diaphoretic.  Psychiatric: She has a normal mood and affect.  Nursing note and vitals reviewed.   ED Course  Procedures (including critical care time) Labs Review Labs Reviewed - No data to display  Imaging Review No results found. I have personally reviewed and evaluated these images and lab results as part of my medical decision-making.   EKG Interpretation None      MDM   Final diagnoses:  Chronic constipation    Filed Vitals:   08/14/15 1357  BP: 151/90  Pulse: 92  Temp: 98.1 F (36.7 C)  TempSrc: Oral  Resp: 16  SpO2: 100%    Adriella Essex is 24 y.o. female presenting with laceration of chronic constipation and abdominal pain. Abdominal exam is nonsurgical. This is typical for her constipation pain. Unfortunately, she states she does not drink any water, she's taking Vicodin for pain control and is chronically using laxatives, With her not to do any of these things. Patient will be started on MiraLAX and Senokot, advised her to push fluids.   Evaluation does not show pathology that would require ongoing emergent intervention or inpatient treatment. Pt is hemodynamically stable and mentating appropriately. Discussed findings and plan with  patient/guardian, who agrees with care plan. All questions answered. Return precautions discussed and outpatient follow up given.   New Prescriptions   POLYETHYLENE GLYCOL POWDER (GLYCOLAX/MIRALAX) POWDER    Take 17 g by mouth 2 (two) times daily as needed. Until daily soft stools  OTC   SENNA-DOCUSATE (SENOKOT-S) 8.6-50 MG TABLET    Start 1 tab QHS, up to 2tabs BID. Max 4 tabs daily for constipation         Wynetta Emery, PA-C 08/14/15 1609  Nelva Nay, MD 08/15/15 1257

## 2015-08-14 NOTE — ED Notes (Addendum)
Pt was seen last month for similar s/s. All labs normal here and was sent to Ob-GYN where vaginal US was completed. No abnormal results except for she was told she had a bulk of stool in intestines. Has tried enemas and laxatives but has not had normal BM in "a while." Also says she has had decreased appetite d/t abdominal cramping and fullness. No other c/c. Hx constipation issues.

## 2017-09-07 IMAGING — CR DG ABDOMEN 2V
2 series · 2 of 2 positions shown · non-contrast
Comparison: None.

CLINICAL DATA: Mid to lower abdominal pain and nausea. Symptoms for
3 weeks.

EXAM:
ABDOMEN - 2 VIEW

[w abdomen upright]
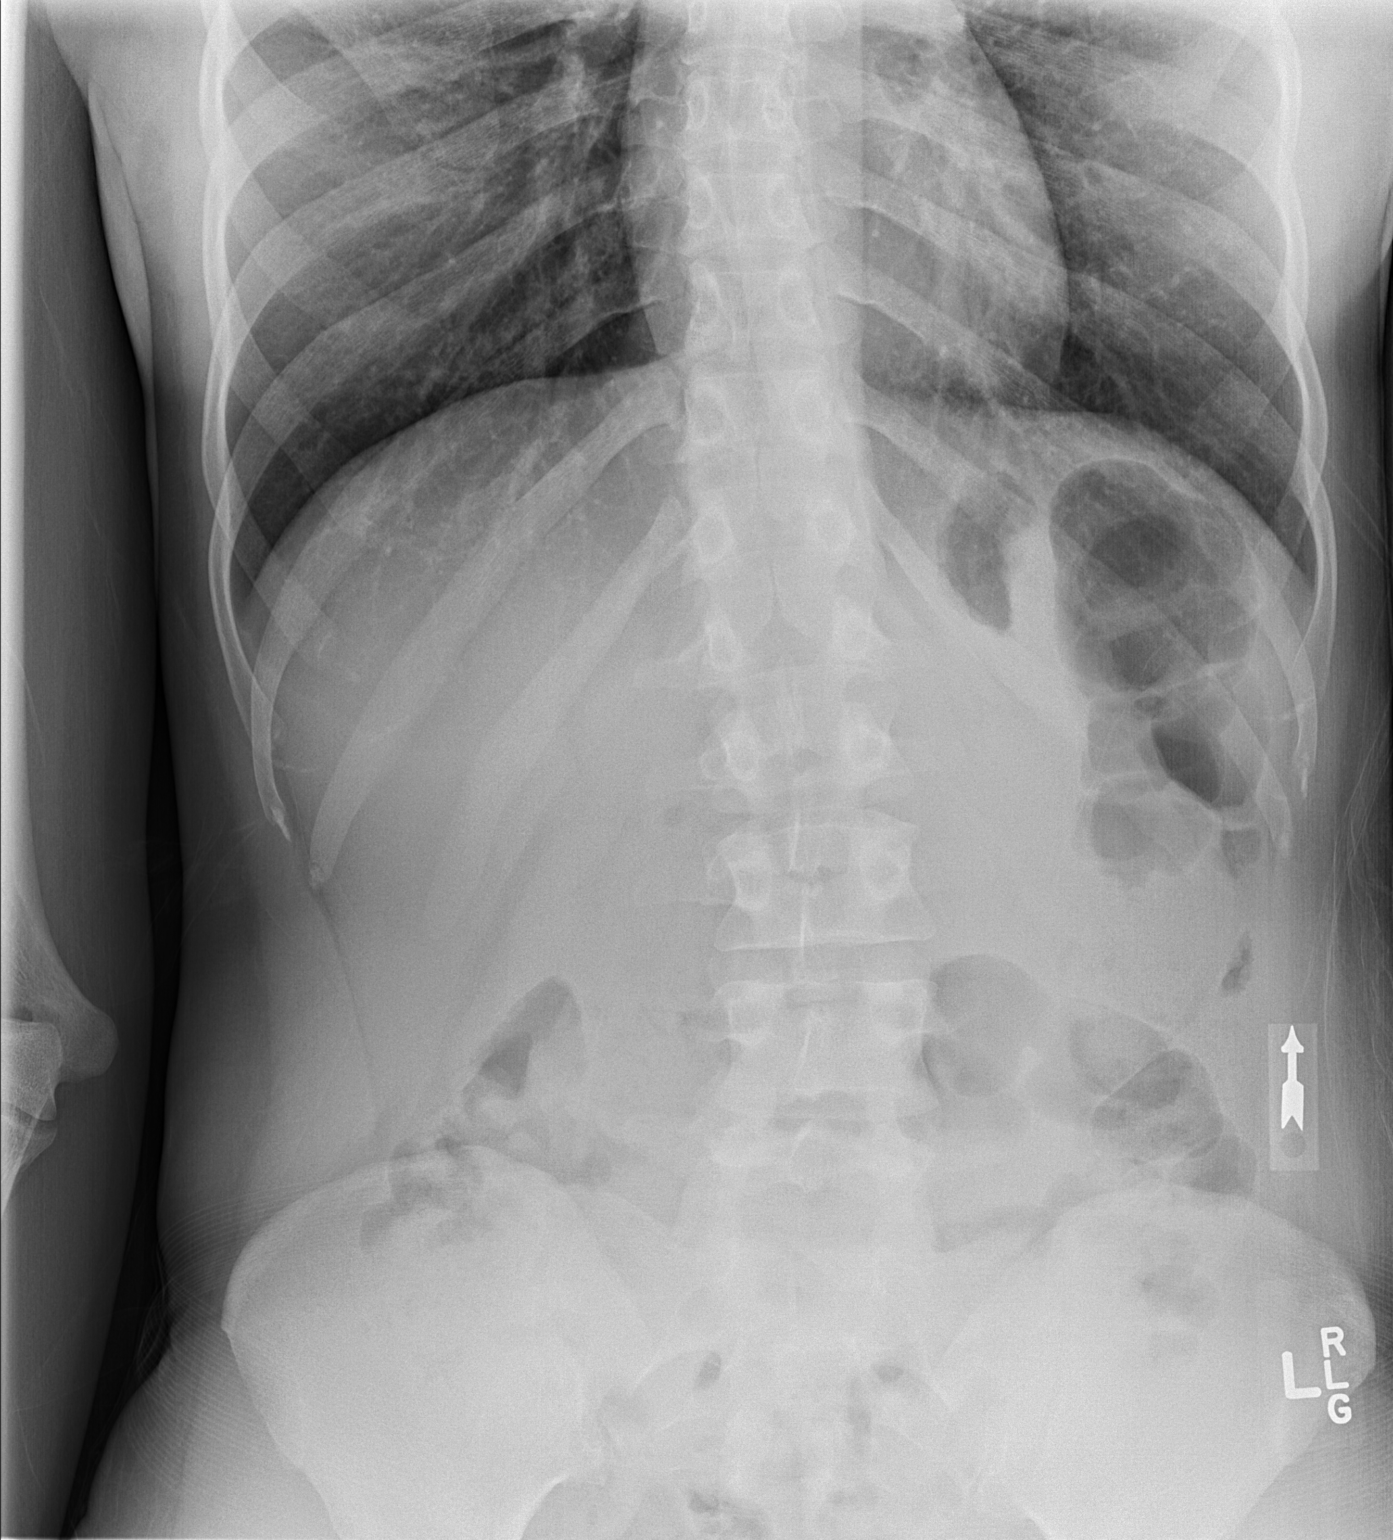

[t abdomen supine]
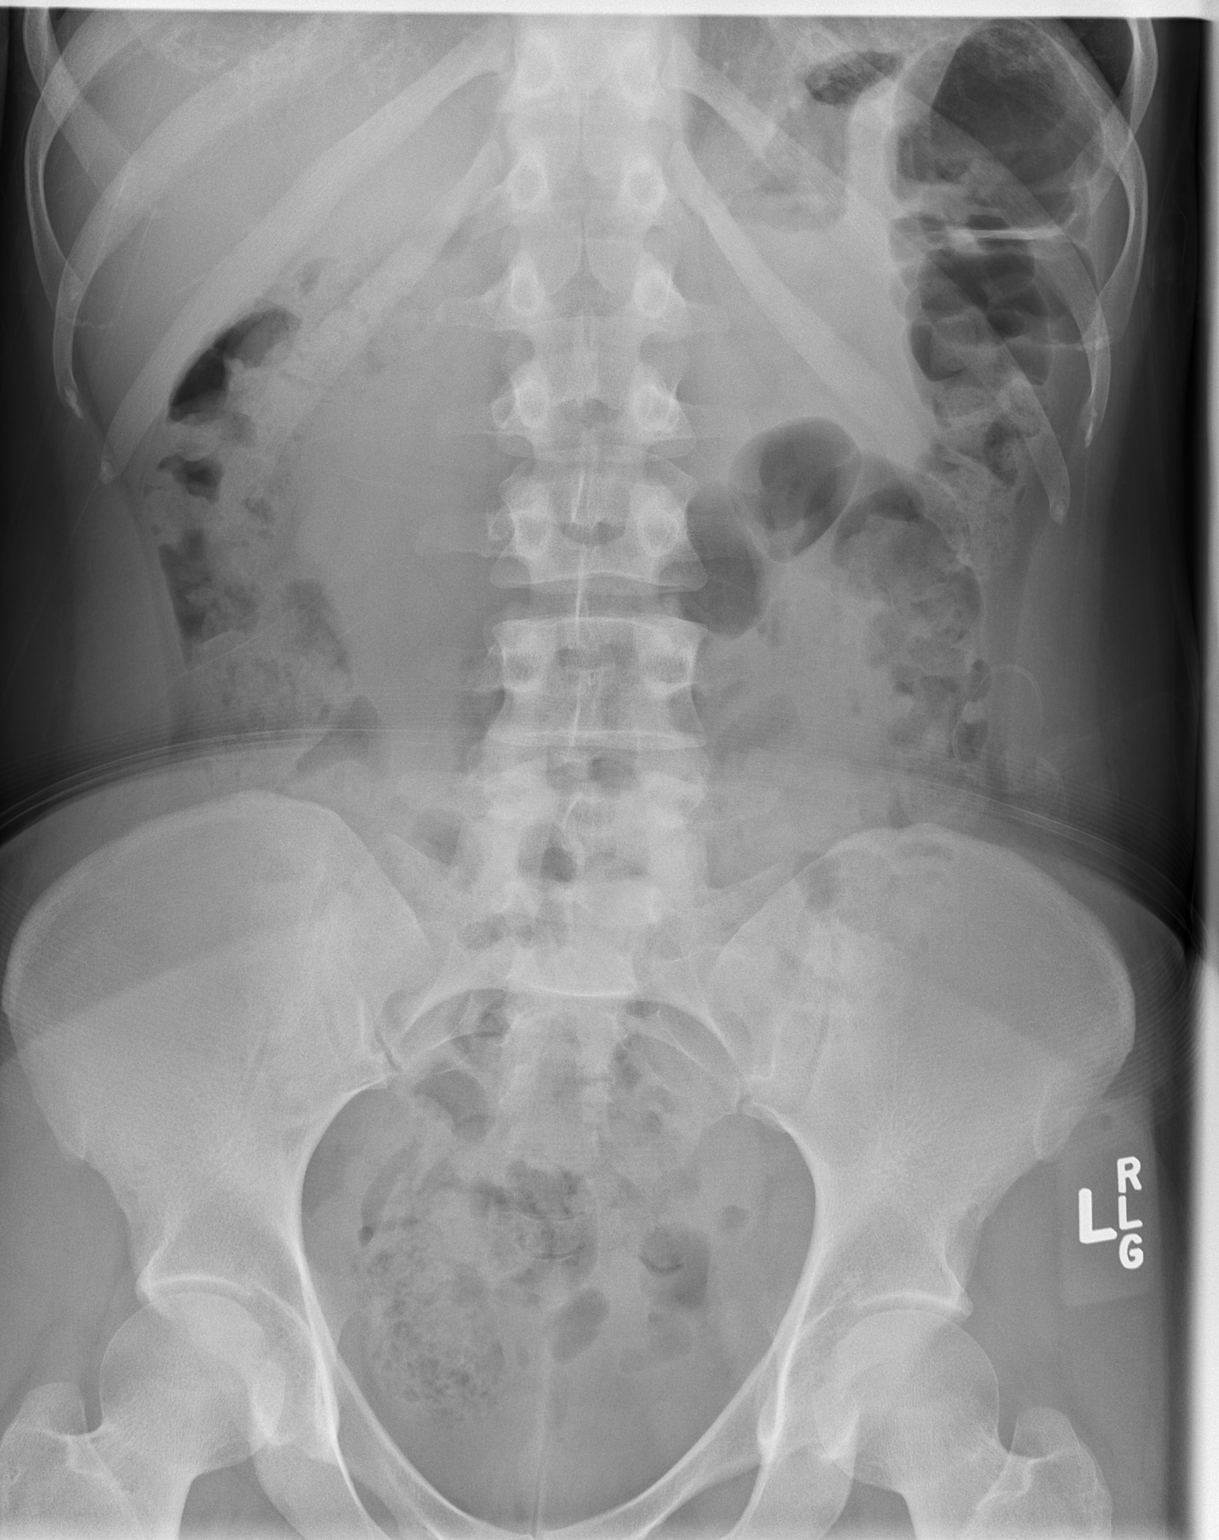

[2 of 2 positions shown; findings below may reference images not displayed]

FINDINGS: Normal bowel gas pattern without dilated bowel loops. Small to
moderate volume of colonic stool. No free intra-abdominal air. No
radiopaque calculi. The lung bases are clear. No acute osseous
abnormalities.
IMPRESSION: Unremarkable radiographs of the abdomen.  Normal bowel gas pattern.
